# Patient Record
Sex: Female | Born: 1980 | Race: Black or African American | Hispanic: No | Marital: Single | Smoking: Current every day smoker
Health system: Southern US, Community
[De-identification: ages and names within clinical notes are randomized; demographics above are authoritative.]

## PROBLEM LIST (undated history)

## (undated) DIAGNOSIS — D649 Anemia, unspecified: Secondary | ICD-10-CM

## (undated) HISTORY — PX: ABDOMINAL HYSTERECTOMY: SHX81

## (undated) HISTORY — PX: CHOLECYSTECTOMY: SHX55

---

## 2016-03-14 ENCOUNTER — Encounter (HOSPITAL_BASED_OUTPATIENT_CLINIC_OR_DEPARTMENT_OTHER): Payer: Self-pay | Admitting: Emergency Medicine

## 2016-03-14 ENCOUNTER — Emergency Department (HOSPITAL_BASED_OUTPATIENT_CLINIC_OR_DEPARTMENT_OTHER)
Admission: EM | Admit: 2016-03-14 | Discharge: 2016-03-14 | Disposition: A | Discharge status: Home / Self Care | Payer: Self-pay | Attending: Emergency Medicine | Admitting: Emergency Medicine

## 2016-03-14 DIAGNOSIS — Z79899 Other long term (current) drug therapy: Secondary | ICD-10-CM | POA: Insufficient documentation

## 2016-03-14 DIAGNOSIS — M7660 Achilles tendinitis, unspecified leg: Secondary | ICD-10-CM

## 2016-03-14 DIAGNOSIS — F17200 Nicotine dependence, unspecified, uncomplicated: Secondary | ICD-10-CM | POA: Insufficient documentation

## 2016-03-14 DIAGNOSIS — M25571 Pain in right ankle and joints of right foot: Secondary | ICD-10-CM | POA: Insufficient documentation

## 2016-03-14 MED ORDER — NAPROXEN 500 MG PO TABS: 500.0000 mg | ORAL_TABLET | Freq: Two times a day (BID) | ORAL | Status: AC

## 2016-03-14 NOTE — ED Notes (Signed)
Pt having pain behind her ankle since Sunday.  Pt states its getting worse over time.  Pt believes its her achilles tendon.

## 2016-03-14 NOTE — ED Provider Notes (Signed)
CSN: [id number]     Arrival date & time 03/14/16  0855 History   First MD Initiated Contact with Patient 03/14/16 [phone]     Chief Complaint  Patient presents with  . Ankle Pain     (Consider location/radiation/quality/duration/timing/severity/associated sxs/prior Treatment) HPI Comments: Patient presents today with pain over the right Achilles Tendon.  She reports that the pain has been intermittent over the past 2 days, which is worsening.  She denies any acute injury or trauma.  Pain worse with dorsiflexion of the right foot and also with ambulation.  She has not taken anything for pain prior to arrival.  She denies any prior injury to the Achilles Tendon.  No erythema or swelling of the ankle or foot.  No numbness or tingling.  No fever or chills.    Patient is a 35 y.o. female presenting with ankle pain. The history is provided by the patient.  Ankle Pain   No past medical history on file. Past Surgical History  Procedure Laterality Date  . Cholecystectomy    . Cesarean section     No family history on file. Social History  Substance Use Topics  . Smoking status: Current Every Day Smoker  . Smokeless tobacco: None  . Alcohol Use: None   OB History    No data available     Review of Systems  All other systems reviewed and are negative.     Allergies  Review of patient's allergies indicates no known allergies.  Home Medications   Prior to Admission medications   Medication Sig Start Date End Date Taking? Authorizing Provider  ferrous sulfate 325 (65 FE) MG tablet Take 325 mg by mouth daily with breakfast.   Yes Historical Provider, MD  vitamin B-12 (CYANOCOBALAMIN) 1000 MCG tablet Take 1,000 mcg by mouth daily.   Yes Historical Provider, MD   BP 144/84 mmHg  Pulse 85  Temp(Src) 98 F (36.7 C) (Oral)  Resp 18  Ht  (1.499 m)  Wt 77.111 kg  BMI 34.32 kg/m2  SpO2 100%  LMP 02/28/2016 Physical Exam  Constitutional: She appears well-developed and  well-nourished.  HENT:  Head: Normocephalic and atraumatic.  Neck: Normal range of motion. Neck supple.  Cardiovascular: Normal rate, regular rhythm and normal heart sounds.   Pulses:      Dorsalis pedis pulses are 2+ on the right side, and 2+ on the left side.  Pulmonary/Chest: Effort normal and breath sounds normal.  Musculoskeletal: Normal range of motion.  Mild tenderness to palpation over the right Achilles tendon.  Increased pain with dorsiflexion of the right foot.  No erythema, edema, or warmth of the right foot or ankle.  No obvious deformity of the Achilles Tendon palpated.  Negative Thompson's sign on the right.  Neurological: She is alert.  Distal sensation of right foot intact  Skin: Skin is warm and dry.  Psychiatric: She has a normal mood and affect.  Nursing note and vitals reviewed.   ED Course  Procedures (including critical care time) Labs Review Labs Reviewed - No data to display  Imaging Review No results found. I have personally reviewed and evaluated these images and lab results as part of my medical decision-making.   EKG Interpretation None      MDM   Final diagnoses:  None   Patient presents today with pain of the right Achilles Tendon.  No acute injury or trauma.  No signs of infection.  Negative Thompson's sign.  Suspect Achilles Tendonitis.  RICE  instructions and ASO given to the patient.  Patient given referral to Orthopedics.  Stable for discharge.  Return precautions given.    Santiago GladHeather Yasheka Fossett, PA-C 03/14/16 [id number]  Lyndal Pulleyaniel Knott, MD 03/15/16 [phone]

## 2016-09-14 ENCOUNTER — Emergency Department (HOSPITAL_BASED_OUTPATIENT_CLINIC_OR_DEPARTMENT_OTHER)
Admission: EM | Admit: 2016-09-14 | Discharge: 2016-09-14 | Disposition: A | Discharge status: Home / Self Care | Payer: Self-pay | Attending: Emergency Medicine | Admitting: Emergency Medicine

## 2016-09-14 ENCOUNTER — Encounter (HOSPITAL_BASED_OUTPATIENT_CLINIC_OR_DEPARTMENT_OTHER): Payer: Self-pay | Admitting: *Deleted

## 2016-09-14 DIAGNOSIS — F17200 Nicotine dependence, unspecified, uncomplicated: Secondary | ICD-10-CM | POA: Insufficient documentation

## 2016-09-14 DIAGNOSIS — H1033 Unspecified acute conjunctivitis, bilateral: Secondary | ICD-10-CM

## 2016-09-14 DIAGNOSIS — H109 Unspecified conjunctivitis: Secondary | ICD-10-CM | POA: Insufficient documentation

## 2016-09-14 DIAGNOSIS — Z79899 Other long term (current) drug therapy: Secondary | ICD-10-CM | POA: Insufficient documentation

## 2016-09-14 MED ORDER — TOBRAMYCIN-DEXAMETHASONE 0.3-0.1 % OP SUSP: 2.0000 [drp] | OPHTHALMIC | 0 refills | Status: DC

## 2016-09-14 MED FILL — TOBRAMYCIN-DEXAMETH OPTH SU: 0.3-0.1 | 5 days supply | Qty: 5 | Fill #0

## 2016-09-14 NOTE — Discharge Instructions (Signed)
After using the drops, wash your hands, and the bottle of drops to prevent reinfection.  Place the drops in both eyes, then lay flat with a warm compress or your eyes for 10 minutes to increase absorption of the medications

## 2016-09-14 NOTE — ED Provider Notes (Signed)
[facility]   CSN: [id number] Arrival date & time: 09/14/16  0920     History   Chief Complaint Chief Complaint  Patient presents with  . Eye Problem    HPI Regina Henderson is a 35 y.o. female. She presents for evaluation of bilateral eye irritation. She noticed irritation last night. Her daughter's friend has been staying with him had "pinkeye". Her eyes are not painful. They're just irritated and draining. No lid swelling. No vision changes. No URI symptoms. No other complaints.  HPI  History reviewed. No pertinent past medical history.  There are no active problems to display for this patient.   Past Surgical History:  Procedure Laterality Date  . CESAREAN SECTION    . CHOLECYSTECTOMY      OB History    No data available       Home Medications    Prior to Admission medications   Medication Sig Start Date End Date Taking? Authorizing Provider  Ped Multivitamins-Fl-Iron (MULTI-VITAMIN/FLUORIDE/IRON PO) Take by mouth.   Yes Historical Provider, MD  ferrous sulfate 325 (65 FE) MG tablet Take 325 mg by mouth daily with breakfast.    Historical Provider, MD  naproxen (NAPROSYN) 500 MG tablet Take 1 tablet (500 mg total) by mouth 2 (two) times daily. 03/14/16   Heather Laisure, PA-C  tobramycin-dexamethasone (TOBRADEX) ophthalmic solution Place 2 drops into both eyes every 4 (four) hours while awake. 09/14/16   Rolland PorterMark Guido Comp, MD  vitamin B-12 (CYANOCOBALAMIN) 1000 MCG tablet Take 1,000 mcg by mouth daily.    Historical Provider, MD    Family History No family history on file.  Social History Social History  Substance Use Topics  . Smoking status: Current Every Day Smoker  . Smokeless tobacco: Not on file  . Alcohol use Not on file     Allergies   Review of patient's allergies indicates no known allergies.   Review of Systems Review of Systems  Constitutional: Negative for appetite change, chills, diaphoresis, fatigue and fever.    HENT: Negative for mouth sores, sore throat and trouble swallowing.   Eyes: Positive for discharge, redness and itching. Negative for visual disturbance.  Respiratory: Negative for cough, chest tightness, shortness of breath and wheezing.   Cardiovascular: Negative for chest pain.  Gastrointestinal: Negative for abdominal distention, abdominal pain, diarrhea, nausea and vomiting.  Endocrine: Negative for polydipsia, polyphagia and polyuria.  Genitourinary: Negative for dysuria, frequency and hematuria.  Musculoskeletal: Negative for gait problem.  Skin: Negative for color change, pallor and rash.  Neurological: Negative for dizziness, syncope, light-headedness and headaches.  Hematological: Does not bruise/bleed easily.  Psychiatric/Behavioral: Negative for behavioral problems and confusion.     Physical Exam Updated Vital Signs BP 151/90 (BP Location: Right Arm)   Pulse 108   Temp 98.8 F (37.1 C) (Oral)   Resp 20   Ht 4\' 10"  (1.473 m)   Wt 176 lb (79.8 kg)   LMP 09/07/2016 (Exact Date)   SpO2 100%   BMI 36.78 kg/m   Physical Exam  Constitutional: She is oriented to person, place, and time. She appears well-developed and well-nourished. No distress.  HENT:  Head: Normocephalic.  Eyes: Pupils are equal, round, and reactive to light. Right conjunctiva is injected. Left conjunctiva is injected. No scleral icterus.  Bilateral conjunctival injection. No lid edema. No localized hordeolum. No proptosis. Normal eye movement. Normal reactive pupils.  No photophobia  Neck: Normal range of motion. Neck supple. No thyromegaly present.  Cardiovascular: Normal rate  and regular rhythm.  Exam reveals no gallop and no friction rub.   No murmur heard. Pulmonary/Chest: Effort normal and breath sounds normal. No respiratory distress. She has no wheezes. She has no rales.  Abdominal: Soft. Bowel sounds are normal. She exhibits no distension. There is no tenderness. There is no rebound.   Musculoskeletal: Normal range of motion.  Neurological: She is alert and oriented to person, place, and time.  Skin: Skin is warm and dry. No rash noted.  Psychiatric: She has a normal mood and affect. Her behavior is normal.     ED Treatments / Results  Labs (all labs ordered are listed, but only abnormal results are displayed) Labs Reviewed - No data to display  EKG  EKG Interpretation None       Radiology No results found.  Procedures Procedures (including critical care time)  Medications Ordered in ED Medications - No data to display   Initial Impression / Assessment and Plan / ED Course  I have reviewed the triage vital signs and the nursing notes.  Pertinent labs & imaging results that were available during my care of the patient were reviewed by me and considered in my medical decision making (see chart for details).  Clinical Course      Final Clinical Impressions(s) / ED Diagnoses   Final diagnoses:  Acute conjunctivitis of both eyes, unspecified acute conjunctivitis type    New Prescriptions New Prescriptions   TOBRAMYCIN-DEXAMETHASONE (TOBRADEX) OPHTHALMIC SOLUTION    Place 2 drops into both eyes every 4 (four) hours while awake.     Rolland Porter, MD 09/14/16 [phone]

## 2016-09-14 NOTE — ED Triage Notes (Signed)
Patient states she had some bilateral eye irritation yesterday, woke up this morning with bilateral eye drainage, itching and crusting on eyes.

## 2019-09-23 ENCOUNTER — Emergency Department (HOSPITAL_BASED_OUTPATIENT_CLINIC_OR_DEPARTMENT_OTHER): Payer: BC Managed Care – PPO

## 2019-09-23 ENCOUNTER — Emergency Department (HOSPITAL_BASED_OUTPATIENT_CLINIC_OR_DEPARTMENT_OTHER)
Admission: EM | Admit: 2019-09-23 | Discharge: 2019-09-23 | Disposition: A | Discharge status: Home / Self Care | Payer: BC Managed Care – PPO | Attending: Emergency Medicine | Admitting: Emergency Medicine

## 2019-09-23 ENCOUNTER — Encounter (HOSPITAL_BASED_OUTPATIENT_CLINIC_OR_DEPARTMENT_OTHER): Payer: Self-pay | Admitting: *Deleted

## 2019-09-23 ENCOUNTER — Other Ambulatory Visit: Payer: Self-pay

## 2019-09-23 DIAGNOSIS — R519 Headache, unspecified: Secondary | ICD-10-CM | POA: Diagnosis present

## 2019-09-23 DIAGNOSIS — F17210 Nicotine dependence, cigarettes, uncomplicated: Secondary | ICD-10-CM | POA: Diagnosis not present

## 2019-09-23 HISTORY — DX: Anemia, unspecified: D64.9

## 2019-09-23 LAB — BASIC METABOLIC PANEL
Anion gap: 10 (ref 5–15)
BUN: 8 mg/dL (ref 6–20)
CO2: 20 mmol/L — ABNORMAL LOW (ref 22–32)
Calcium: 9.7 mg/dL (ref 8.9–10.3)
Chloride: 106 mmol/L (ref 98–111)
Creatinine, Ser: 0.78 mg/dL (ref 0.44–1.00)
GFR calc Af Amer: >60 mL/min (ref >60)
GFR calc non Af Amer: >60 mL/min (ref >60)
Glucose, Bld: 88 mg/dL (ref 70–99)
Potassium: 3.6 mmol/L (ref 3.5–5.1)
Sodium: 136 mmol/L (ref 135–145)

## 2019-09-23 LAB — CBC WITH DIFFERENTIAL/PLATELET
Abs Immature Granulocytes: 0.07 10*3/uL (ref 0.00–0.07)
Basophils Absolute: 0 10*3/uL (ref 0.0–0.1)
Basophils Relative: 0 %
Eosinophils Absolute: 0.4 10*3/uL (ref 0.0–0.5)
Eosinophils Relative: 4 %
HCT: 30.8 % — ABNORMAL LOW (ref 36.0–46.0)
Hemoglobin: 8.8 g/dL — ABNORMAL LOW (ref 12.0–15.0)
Immature Granulocytes: 1 %
Lymphocytes Relative: 30 %
Lymphs Abs: 3.3 10*3/uL (ref 0.7–4.0)
MCH: 21 pg — ABNORMAL LOW (ref 26.0–34.0)
MCHC: 28.6 g/dL — ABNORMAL LOW (ref 30.0–36.0)
MCV: 73.3 fL — ABNORMAL LOW (ref 80.0–100.0)
Monocytes Absolute: 0.6 10*3/uL (ref 0.1–1.0)
Monocytes Relative: 5 %
Neutro Abs: 6.6 10*3/uL (ref 1.7–7.7)
Neutrophils Relative %: 60 %
Platelets: 530 10*3/uL — ABNORMAL HIGH (ref 150–400)
RBC: 4.2 MIL/uL (ref 3.87–5.11)
RDW: 24.9 % — ABNORMAL HIGH (ref 11.5–15.5)
Smear Review: INCREASED
WBC: 11.1 10*3/uL — ABNORMAL HIGH (ref 4.0–10.5)
nRBC: 0.4 % — ABNORMAL HIGH (ref 0.0–0.2)

## 2019-09-23 MED ORDER — SODIUM CHLORIDE 0.9 % IV BOLUS
1000.0000 mL | Freq: Once | INTRAVENOUS | Status: AC
  Administered 2019-09-23: 1000 mL via INTRAVENOUS

## 2019-09-23 MED ORDER — PROCHLORPERAZINE EDISYLATE 10 MG/2ML IJ SOLN
10.0000 mg | Freq: Once | INTRAMUSCULAR | Status: AC
  Administered 2019-09-23: 10 mg via INTRAVENOUS
  Filled 2019-09-23: qty 2

## 2019-09-23 MED ORDER — DIPHENHYDRAMINE HCL 50 MG/ML IJ SOLN
25.0000 mg | Freq: Once | INTRAMUSCULAR | Status: AC
  Administered 2019-09-23: 25 mg via INTRAVENOUS
  Filled 2019-09-23: qty 1

## 2019-09-23 MED ORDER — KETOROLAC TROMETHAMINE 30 MG/ML IJ SOLN
30.0000 mg | Freq: Once | INTRAMUSCULAR | Status: AC
  Administered 2019-09-23: 30 mg via INTRAVENOUS
  Filled 2019-09-23: qty 1

## 2019-09-23 NOTE — ED Notes (Signed)
Pt returned from CT °

## 2019-09-23 NOTE — ED Triage Notes (Addendum)
Head ache to left side  x 1 week,  Denies inj  otc meds at times  States has ha when iron gets low but has been taking iron

## 2019-09-23 NOTE — ED Provider Notes (Signed)
[facility] EMERGENCY DEPARTMENT Provider Note   CSN: [id number] Arrival date & time: 09/23/19  1724     History   Chief Complaint Chief Complaint  Patient presents with  . Headache    HPI Regina Henderson is a 38 y.o. female with history of anemia presents for evaluation of gradual onset, progressively worsening left-sided headache for 1 week.  She reports that the headache is mostly constant but will improve after taking Advil PM in the evenings to the point that she is able to fall asleep.  She does note that the headache is still present when she awakens the next morning.  It is dull and throbbing "like a toothache ", worst posterior to the left ear but will radiate to the entire left side of the cranium when the headache worsens.  She also notes associated photophobia but denies blurred vision, dizziness, nausea, vomiting, chest pain, shortness of breath, numbness or weakness of the extremities, fever, or neck stiffness. No phonophobia. She states that she does have headaches when her iron gets low but this feels different from those headaches.  Reports she has been compliant with her iron supplement.     The history is provided by the patient.    Past Medical History:  Diagnosis Date  . Anemia     There are no active problems to display for this patient.   Past Surgical History:  Procedure Laterality Date  . CESAREAN SECTION    . CHOLECYSTECTOMY       OB History   No obstetric history on file.      Home Medications    Prior to Admission medications   Medication Sig Start Date End Date Taking? Authorizing Provider  ferrous sulfate 325 (65 FE) MG tablet Take 325 mg by mouth daily with breakfast.    [provider]  naproxen (NAPROSYN) 500 MG tablet Take 1 tablet (500 mg total) by mouth 2 (two) times daily. 03/14/16   Santiago Glad, PA-C  Ped Multivitamins-Fl-Iron (MULTI-VITAMIN/FLUORIDE/IRON PO) Take by mouth.    [provider]   tobramycin-dexamethasone Wallene Dales) ophthalmic solution Place 2 drops into both eyes every 4 (four) hours while awake. 09/14/16   Rolland Porter, MD  vitamin B-12 (CYANOCOBALAMIN) 1000 MCG tablet Take 1,000 mcg by mouth daily.    [provider]    Family History No family history on file.  Social History Social History   Tobacco Use  . Smoking status: Current Every Day Smoker  Substance Use Topics  . Alcohol use: Not on file  . Drug use: Not on file     Allergies   Patient has no known allergies.   Review of Systems Review of Systems  Constitutional: Negative for chills and fever.  HENT: Negative for ear discharge and ear pain.   Eyes: Positive for photophobia. Negative for visual disturbance.  Respiratory: Negative for shortness of breath.   Cardiovascular: Negative for chest pain.  Gastrointestinal: Negative for abdominal pain, nausea and vomiting.  Musculoskeletal: Negative for neck stiffness.  Neurological: Positive for headaches. Negative for syncope, weakness, light-headedness and numbness.  All other systems reviewed and are negative.    Physical Exam Updated Vital Signs BP 132/80 (BP Location: Left Arm)   Pulse 70   Temp 98.7 F (37.1 C)   Resp 20   Ht  (1.499 m)   Wt 83.9 kg   LMP 09/11/2019 (Approximate)   SpO2 99%   BMI 37.37 kg/m   Physical Exam Vitals signs and nursing  note reviewed.  Constitutional:      General: She is not in acute distress.    Appearance: She is well-developed.  HENT:     Head: Normocephalic and atraumatic.     Right Ear: Tympanic membrane, ear canal and external ear normal. No decreased hearing noted. No drainage, swelling or tenderness.     Left Ear: Tympanic membrane and ear canal normal. No decreased hearing noted. No drainage, swelling or tenderness.     Ears:     Comments: Mild left mastoid tenderness but no swelling, erythema, or crepitus.  Eyes:     General:        Right eye: No discharge.         Left eye: No discharge.     Extraocular Movements: Extraocular movements intact.     Conjunctiva/sclera: Conjunctivae normal.     Pupils: Pupils are equal, round, and reactive to light.     Comments: Some pain with EOMs of the left eye laterally. No restriction noted.  No periorbital swelling, erythema, or warmth.  No chemosis, proptosis, or consensual photophobia.  Neck:     Musculoskeletal: Normal range of motion and neck supple. No neck rigidity.     Vascular: No JVD.     Trachea: No tracheal deviation.  Cardiovascular:     Rate and Rhythm: Normal rate and regular rhythm.  Pulmonary:     Effort: Pulmonary effort is normal.     Breath sounds: Normal breath sounds.  Abdominal:     General: Bowel sounds are normal. There is no distension.     Palpations: Abdomen is soft.  Skin:    General: Skin is warm and dry.     Findings: No erythema.  Neurological:     Mental Status: She is alert and oriented to person, place, and time.     Comments: Mental Status:  Alert, thought content appropriate, able to give a coherent history. Speech fluent without evidence of aphasia. Able to follow 2 step commands without difficulty.  Cranial Nerves:  II:  Peripheral visual fields grossly normal, pupils equal, round, reactive to light III,IV, VI: ptosis not present, extra-ocular motions intact bilaterally  V,VII: smile symmetric, facial light touch sensation equal VIII: hearing grossly normal to voice  X: uvula elevates symmetrically  XI: bilateral shoulder shrug symmetric and strong XII: midline tongue extension without fassiculations Motor:  Normal tone. 5/5 strength of BUE and BLE major muscle groups including strong and equal grip strength and dorsiflexion/plantar flexion Sensory: light touch normal in all extremities. Cerebellar: normal finger-to-nose with bilateral upper extremities, Romberg sign absent.  Gait: normal gait and balance. Able to walk on toes and heels with ease.      Psychiatric:        Behavior: Behavior normal.      ED Treatments / Results  Labs (all labs ordered are listed, but only abnormal results are displayed) Labs Reviewed  BASIC METABOLIC PANEL - Abnormal; Notable for the following components:      Result Value   CO2 20 (*)    All other components within normal limits  CBC WITH DIFFERENTIAL/PLATELET - Abnormal; Notable for the following components:   WBC 11.1 (*)    Hemoglobin 8.8 (*)    HCT 30.8 (*)    MCV 73.3 (*)    MCH 21.0 (*)    MCHC 28.6 (*)    RDW 24.9 (*)    Platelets 530 (*)    nRBC 0.4 (*)    All other components  within normal limits    EKG None  Radiology Ct Head Wo Contrast  Result Date: 09/23/2019 CLINICAL DATA:  Left-sided headache for 1 week EXAM: CT HEAD WITHOUT CONTRAST TECHNIQUE: Contiguous axial images were obtained from the base of the skull through the vertex without intravenous contrast. COMPARISON:  May 04, 2018 FINDINGS: Brain: The ventricles are normal in size and free aeration. 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 There is no intracranial mass, hemorrhage, extra-axial fluid collection, or midline shift 1. Brain parenchyma appears unremarkable. No acute infarct is evident. Vascular: There is no hyperdense vessel. There is no vascular calcification. Skull: The bony calvarium appears intact. Sinuses/Orbits: Paranasal sinuses are clear. Orbits appear symmetric bilaterally. There is leftward deviation of the nasal septum. Other: Mastoid air cells are clear. IMPRESSION: Brain parenchyma appears unremarkable. No mass or hemorrhage. 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 There is leftward deviation of the nasal septum. Electronically Signed   By: Bretta Bang III M.D.   On: 09/23/2019 19:34    Procedures Procedures (including critical care time)  Medications Ordered in ED Medications  sodium chloride 0.9 % bolus 1,000 mL (0  mLs Intravenous Stopped 09/23/19 2119)  prochlorperazine (COMPAZINE) injection 10 mg (10 mg Intravenous Given 09/23/19 2022)  diphenhydrAMINE (BENADRYL) injection 25 mg (25 mg Intravenous Given 09/23/19 2020)  ketorolac (TORADOL) 30 MG/ML injection 30 mg (30 mg Intravenous Given 09/23/19 2018)     Initial Impression / Assessment and Plan / ED Course  I have reviewed the triage vital signs and the nursing notes.  Pertinent labs & imaging results that were available during my care of the patient were reviewed by me and considered in my medical decision making (see chart for details).        Patient presenting for evaluation of left-sided headache for 1 week.  She is afebrile, initially mildly hypertensive with improvement on reevaluation.  Some tenderness around the mastoid on examination but no ear pain, erythema, crepitus, swelling, or evidence of otitis externa or acute otitis media.  She has no constitutional symptoms.  I have a low suspicion of mastoiditis at this time.  Neurologic examination is normal with no focal deficits.  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  No red flag signs concerning for SAH, ICH, CVA, or meningitis.  She has no nuchal rigidity or meningeal signs. No evidence of ocular nerve entrapment, no vision changes. Doubt MS or optic neuritis. Lab work reviewed by me shows mild nonspecific leukocytosis, no metabolic derangements, no renal insufficiency.  Her hemoglobin is low at 8.8 but she tells me her baseline is around 6.  She was given a migraine cocktail in the ED and on reevaluation reports her headache is entirely resolved and that she is feeling much better and would like to go home.  Recommend follow-up with PCP or neurology on an outpatient basis for reevaluation of her headaches which sound like migraine headaches.  Discussed strict ED return  precautions.  Patient and daughter verbalized understanding of and agreement with plan and patient stable for discharge home at this time.  Final Clinical Impressions(s) / ED Diagnoses   Final diagnoses:  Left-sided headache    ED Discharge Orders    None       Jeanie Sewer, PA-C 09/24/19 1920  Deno Etienne, DO 09/24/19 1952

## 2019-09-23 NOTE — ED Notes (Signed)
Patient transported to CT 

## 2019-09-23 NOTE — Discharge Instructions (Addendum)
You can take 1 to 2 tablets of Tylenol (350mg -1000mg  depending on the dose) every 6 hours as needed for pain.  Do not exceed 4000 mg of Tylenol daily.  If your pain persists you can take a doses of ibuprofen in between doses of Tylenol.  I usually recommend 400 to 600 mg of ibuprofen every 6 hours.  Take this with food to avoid upset stomach issues.  Drink plenty fluids and get plenty of rest.  Can also apply heating pad 20 minutes at a time 3-4 times daily to the neck and do some gentle stretching to avoid muscle stiffness.  Follow-up with a PCP or a neurologist for reevaluation of your headaches.  Return to the emergency department if any concerning signs or symptoms develop such as severe worsening pain, vision changes, weakness to one side of the body, persistent vomiting, fevers, or loss of consciousness.

## 2019-11-12 ENCOUNTER — Encounter: Payer: Self-pay | Admitting: Diagnostic Neuroimaging

## 2019-11-12 ENCOUNTER — Ambulatory Visit (INDEPENDENT_AMBULATORY_CARE_PROVIDER_SITE_OTHER): Payer: BC Managed Care – PPO | Admitting: Diagnostic Neuroimaging

## 2019-11-12 ENCOUNTER — Other Ambulatory Visit: Payer: Self-pay

## 2019-11-12 VITALS — BP 151/99 | HR 83 | Temp 98.5°F | Ht 59.0 in | Wt 206.0 lb

## 2019-11-12 DIAGNOSIS — G43109 Migraine with aura, not intractable, without status migrainosus: Secondary | ICD-10-CM

## 2019-11-12 DIAGNOSIS — H538 Other visual disturbances: Secondary | ICD-10-CM | POA: Diagnosis not present

## 2019-11-12 DIAGNOSIS — G4489 Other headache syndrome: Secondary | ICD-10-CM | POA: Diagnosis not present

## 2019-11-12 DIAGNOSIS — R519 Headache, unspecified: Secondary | ICD-10-CM | POA: Diagnosis not present

## 2019-11-12 MED ORDER — RIZATRIPTAN BENZOATE 10 MG PO TBDP: 10.0000 mg | ORAL_TABLET | ORAL | 11 refills | Status: AC | PRN

## 2019-11-12 MED ORDER — TOPIRAMATE 50 MG PO TABS: 50.0000 mg | ORAL_TABLET | Freq: Two times a day (BID) | ORAL | 12 refills | Status: AC

## 2019-11-12 NOTE — Progress Notes (Signed)
[facility]  PATIENT: Regina Henderson DOB: May 25, 1981  REFERRING CLINICIAN: Calton DachW Woodruff HISTORY FROM: patient  REASON FOR VISIT: new consult    HISTORICAL  CHIEF COMPLAINT:  Chief Complaint  Patient presents with  . Headache    rm 7, New Pt "headache on left side of head x 3 weeks, Advil PM helps but puts me to sleep"    HISTORY OF PRESENT ILLNESS:   38 year old female here for evaluation of headaches.  September 2020 patient had onset of left temporal, left occipital, left neck headaches, pain, associated with heartbeat sensation, photophobia.  Sometimes associated with blurred vision, seeing dark spots, seeing sparkles.  No nausea or vomiting.  No prior similar headaches.  Patient was in the hospital for evaluation due to severe headaches and had CT scan of the head which were unremarkable.  Patient has had increased stress related to working from home and spending a lot of time in the computer.  Also has had 20 pound weight gain over the past year.  Patient has been having more sleeping problems including snoring.     REVIEW OF SYSTEMS: Full 14 system review of systems performed and negative with exception of: As per HPI.  ALLERGIES: No Known Allergies  HOME MEDICATIONS: Outpatient Medications Prior to Visit  Medication Sig Dispense Refill  . ferrous sulfate 325 (65 FE) MG tablet Take 325 mg by mouth daily with breakfast.    . naproxen (NAPROSYN) 500 MG tablet Take 1 tablet (500 mg total) by mouth 2 (two) times daily. 30 tablet 0  . vitamin B-12 (CYANOCOBALAMIN) 1000 MCG tablet Take 1,000 mcg by mouth daily.    . Ped Multivitamins-Fl-Iron (MULTI-VITAMIN/FLUORIDE/IRON PO) Take by mouth.    . tobramycin-dexamethasone (TOBRADEX) ophthalmic solution Place 2 drops into both eyes every 4 (four) hours while awake. 5 mL 0   No facility-administered medications prior to visit.     PAST MEDICAL HISTORY: Past Medical History:  Diagnosis Date  . Anemia     PAST SURGICAL HISTORY: Past Surgical History:  Procedure Laterality Date  . CESAREAN SECTION     x 2  . CHOLECYSTECTOMY      FAMILY HISTORY: Family History  Problem Relation Age of Onset  . Kidney failure Mother   . Diabetes Father   . Dementia Maternal Grandmother     SOCIAL HISTORY: Social History   Socioeconomic History  . Marital status: Single    Spouse name: Not on file  . Number of children: 2  . Years of education: Not on file  . Highest education level: High school graduate  Occupational History  . Not on file  Social Needs  . Financial resource strain: Not on file  . Food insecurity    Worry: Not on file    Inability: Not on file  . Transportation needs    Medical: Not on file    Non-medical: Not on file  Tobacco Use  . Smoking status: Current Every Day Smoker    Packs/day: 0.50  . Smokeless tobacco: Never Used  Substance and Sexual Activity  . Alcohol use: Not Currently  . Drug use: Not Currently  . Sexual activity: Not on file  Lifestyle  . Physical activity    Days per week: Not on file    Minutes per session: Not on file  . Stress: Not on file  Relationships  . Social Musicianconnections    Talks on phone: Not on file    Gets together: Not on file  Attends religious service: Not on file    Active member of club or organization: Not on file    Attends meetings of clubs or organizations: Not on file    Relationship status: Not on file  . Intimate partner violence    Fear of current or ex partner: Not on file    Emotionally abused: Not on file    Physically abused: Not on file    Forced sexual activity: Not on file  Other Topics Concern  . Not on file  Social History Narrative   Lives with youngest daughter   Caffeine- none in past week      PHYSICAL EXAM  GENERAL EXAM/CONSTITUTIONAL: Vitals:  Vitals:   11/12/19 0945  BP: (!) 151/99  Pulse: 83  Temp: 98.5 F (36.9 C)  Weight: 206 lb (93.4 kg)  Height: 4\' 11"  (1.499 m)      Body mass index is 41.61 kg/m. Wt Readings from Last 3 Encounters:  11/12/19 206 lb (93.4 kg)  09/23/19 185 lb (83.9 kg)  09/14/16 176 lb (79.8 kg)     Patient is in no distress; well developed, nourished and groomed; neck is supple  CARDIOVASCULAR:  Examination of carotid arteries is normal; no carotid bruits  Regular rate and rhythm, no murmurs  Examination of peripheral vascular system by observation and palpation is normal  EYES:  Ophthalmoscopic exam of optic discs and posterior segments is normal; no papilledema or hemorrhages  No exam data present  MUSCULOSKELETAL:  Gait, strength, tone, movements noted in Neurologic exam below  NEUROLOGIC: MENTAL STATUS:  No flowsheet data found.  awake, alert, oriented to person, place and time  recent and remote memory intact  normal attention and concentration  language fluent, comprehension intact, naming intact  fund of knowledge appropriate  CRANIAL NERVE:   2nd - no papilledema on fundoscopic exam  2nd, 3rd, 4th, 6th - pupils equal and reactive to light, visual fields full to confrontation, extraocular muscles intact, no nystagmus  5th - facial sensation symmetric  7th - facial strength symmetric  8th - hearing intact  9th - palate elevates symmetrically, uvula midline  11th - shoulder shrug symmetric  12th - tongue protrusion midline  MOTOR:   normal bulk and tone, full strength in the BUE, BLE  SENSORY:   normal and symmetric to light touch, pinprick, temperature, vibration  COORDINATION:   finger-nose-finger, fine finger movements normal  REFLEXES:   deep tendon reflexes present and symmetric  GAIT/STATION:   narrow based gait     DIAGNOSTIC DATA (LABS, IMAGING, TESTING) - I reviewed patient records, labs, notes, testing and imaging myself where available.  Lab Results  Component Value Date   WBC 11.1 (H) 09/23/2019   HGB 8.8 (L) 09/23/2019   HCT 30.8 (L) 09/23/2019   MCV  73.3 (L) 09/23/2019   PLT 530 (H) 09/23/2019      Component Value Date/Time   NA 136 09/23/2019 2011   K 3.6 09/23/2019 2011   CL 106 09/23/2019 2011   CO2 20 (L) 09/23/2019 2011   GLUCOSE 88 09/23/2019 2011   BUN 8 09/23/2019 2011   CREATININE 0.78 09/23/2019 2011   CALCIUM 9.7 09/23/2019 2011   GFRNONAA >60 09/23/2019 2011   GFRAA >60 09/23/2019 2011   No results found for: CHOL, HDL, LDLCALC, LDLDIRECT, TRIG, CHOLHDL No results found for: HGBA1C No results found for: VITAMINB12 No results found for: TSH   09/23/19 CT head [I reviewed images myself and agree with interpretation. -VRP]  -  negative   ASSESSMENT AND PLAN  38 y.o. year old female here with new onset headaches since October 2020, with migraine features.  Considerations include migraine, IH or sleep apnea associated headaches.  Patient has had some weight gain over the past year.  We will proceed with further work-up.  Also will try migraine treatments.  Dx:  1. New onset headache   2. Migraine with aura and without status migrainosus, not intractable   3. Other headache syndrome      PLAN:  NEW ONSET HEADACHES (? Migraine vs IIH vs sleep apnea) - check MRI brain w/wo  - ophthalmology evaluation (eval blurred vision)  - then consider LP  - consider sleep study  - MIGRAINE PREVENTION --> start topiramate 50mg  at bedtime; after 1 week increase to twice a day; drink plenty of water  - MIGRAINE RESCUE --> rizatriptan 10mg  as needed for breakthrough headache; may repeat x 1 after 2 hours; max 2 tabs per day or 8 per month    Orders Placed This Encounter  Procedures  . MR BRAIN W WO CONTRAST  . Ambulatory referral to Ophthalmology   Meds ordered this encounter  Medications  . topiramate (TOPAMAX) 50 MG tablet    Sig: Take 1 tablet (50 mg total) by mouth 2 (two) times daily.    Dispense:  60 tablet    Refill:  12  . rizatriptan (MAXALT-MLT) 10 MG disintegrating tablet    Sig: Take 1 tablet (10  mg total) by mouth as needed for migraine. May repeat in 2 hours if needed    Dispense:  9 tablet    Refill:  11   Return in about 4 months (around 03/12/2020).    , MD 11/12/2019, 10:20 AM Certified in Neurology, Neurophysiology and Neuroimaging  [facility] [address] [place], [address] [place] [phone]

## 2019-11-12 NOTE — Patient Instructions (Addendum)
NEW ONSET HEADACHES  - check MRI brain w/wo  - ophthalmology evaluation (eval blurred vision)  - then consider LP  - consider sleep study  - MIGRAINE PREVENTION --> start topiramate 50mg  at bedtime; after 1 week increase to twice a day; drink plenty of water  - MIGRAINE RESCUE --> rizatriptan 10mg  as needed for breakthrough headache; may repeat x 1 after 2 hours; max 2 tabs per day or 8 per month

## 2019-11-13 ENCOUNTER — Telehealth: Payer: Self-pay | Admitting: Diagnostic Neuroimaging

## 2019-11-13 NOTE — Telephone Encounter (Signed)
Patient called stating that she has been feeling dizzy/light headed/drowzy all day and believes it could be due to the topiramate (TOPAMAX) 50 MG tablet but is unsure.    Please follow up

## 2019-11-13 NOTE — Telephone Encounter (Signed)
Called patient and advised her those are common side effects of topamax. I advised she needs to drink plenty of water, take it at night (she is) and give it a week. She can stay on 50 mg nightly dose longer than a week if she wishes. I advised if she finds she cannot tolerate the side effects or they don't resolve to her satisfaction, she should call. Dr Leta Baptist can consider an alternative medication. Patient verbalized understanding, appreciation.

## 2019-11-13 NOTE — Telephone Encounter (Signed)
Agree. -VRP 

## 2019-11-24 ENCOUNTER — Telehealth: Payer: Self-pay

## 2019-11-24 NOTE — Telephone Encounter (Signed)
Per [employer], MRI Brain w/wo does not meet medical necessity criteria based on the info provided.   "Advanced imaging for evaluation of headaches is not indicated for primary headache in the absence of focal neurologic findings or failure to respond to an adequate course of treatment.  I have printed the clinical details and placed on Dr. Gladstone Lighter desk to review.

## 2020-03-15 ENCOUNTER — Ambulatory Visit: Payer: BC Managed Care – PPO | Admitting: Family Medicine

## 2021-01-06 IMAGING — CT CT HEAD W/O CM
3 series · 14 of 46 positions shown, 16 images · non-contrast
Comparison: May 04, 2018

CLINICAL DATA: Left-sided headache for 1 week

EXAM:
CT HEAD WITHOUT CONTRAST
TECHNIQUE: Contiguous axial images were obtained from the base of the skull
through the vertex without intravenous contrast.

[Series 2: head wo · axial · 0.42mm/px · z∈[+1090,+1210]mm · 8 of 29 slices shown, 10 images]
[im 3/29  brain]
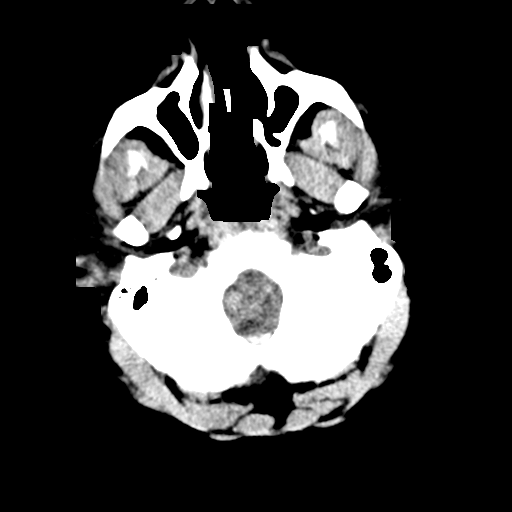
[im 3/29  bone]
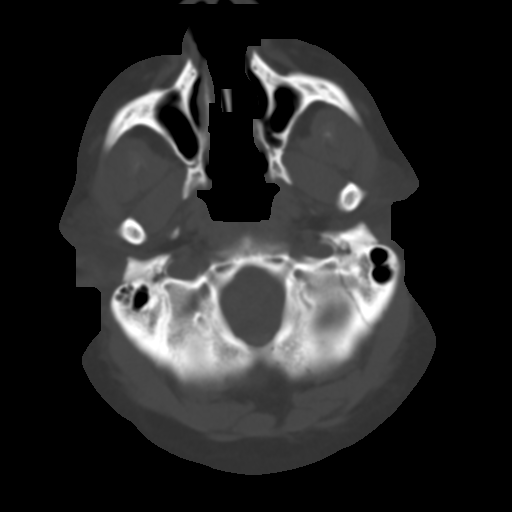
[im 7/29  brain]
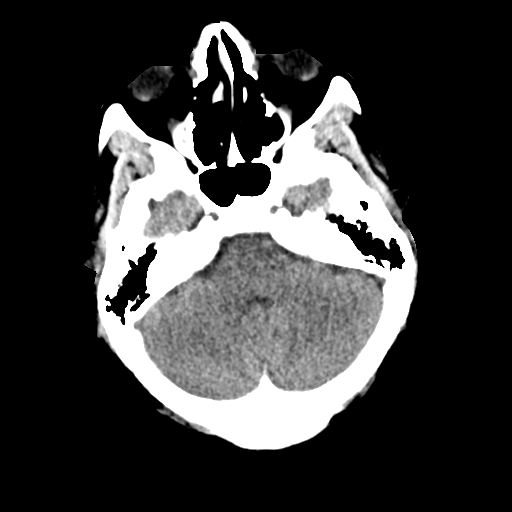
[im 10/29  brain]
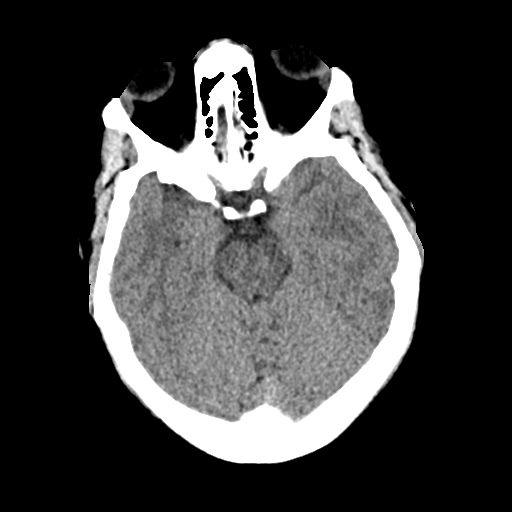
[im 13/29  brain]
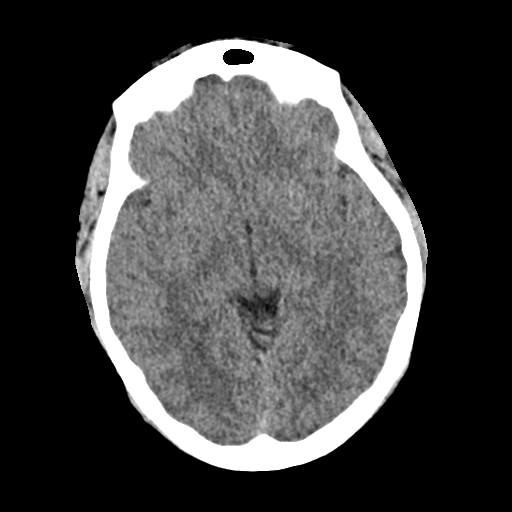
[im 17/29  brain]
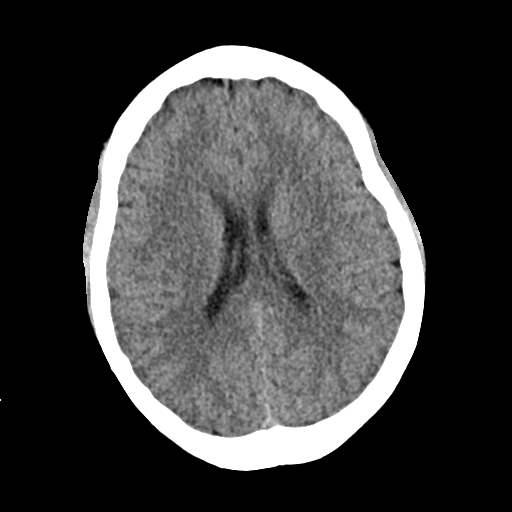
[im 17/29  bone]
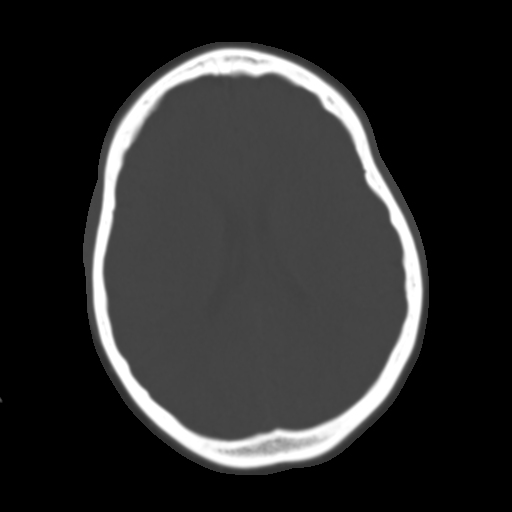
[im 20/29  brain]
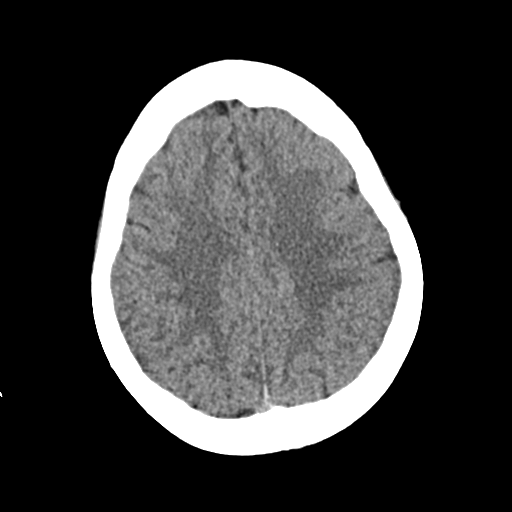
[im 23/29  brain]
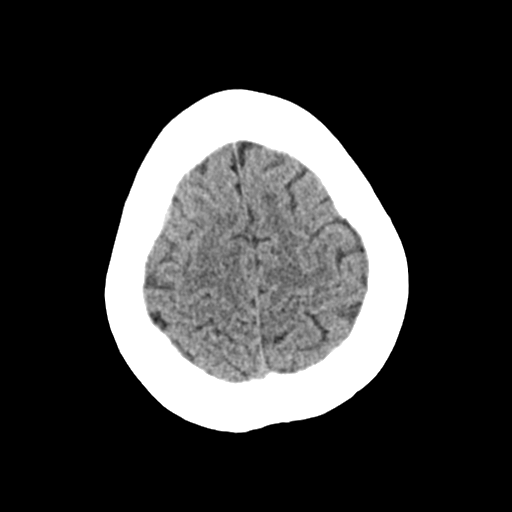
[im 27/29  brain]
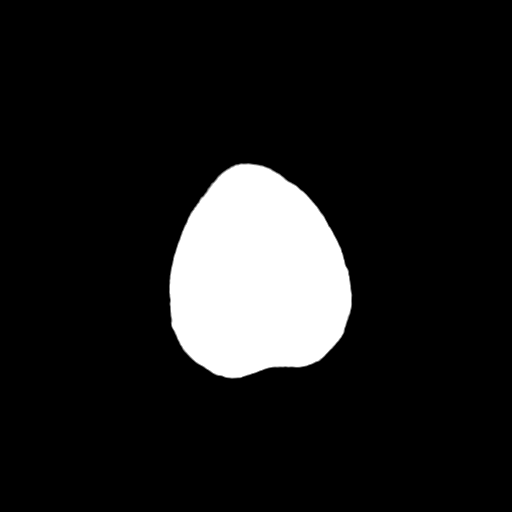

[Series 4: coronal soft · coronal · 0.29mm/px · 3 of 67 slices shown]
[im 23/67  brain]
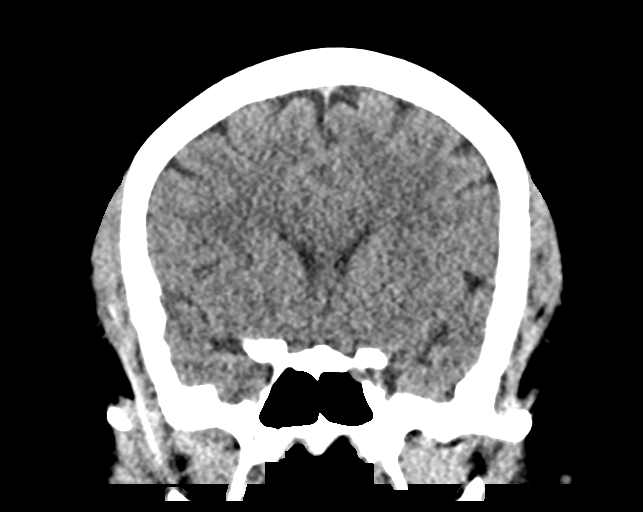
[im 30/67  brain]
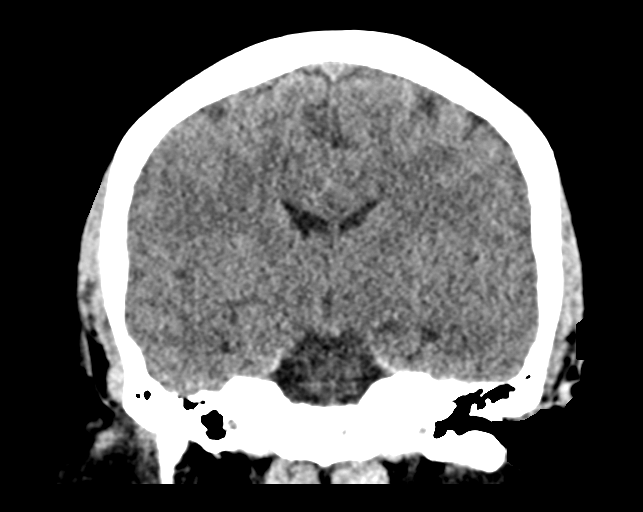
[im 37/67  brain]
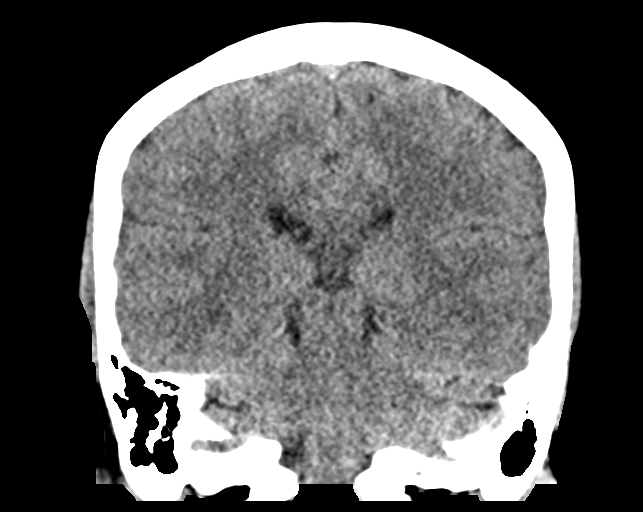

[Series 5: sag soft · sagittal · 0.29mm/px · 3 of 58 slices shown]
[im 20/58  brain]
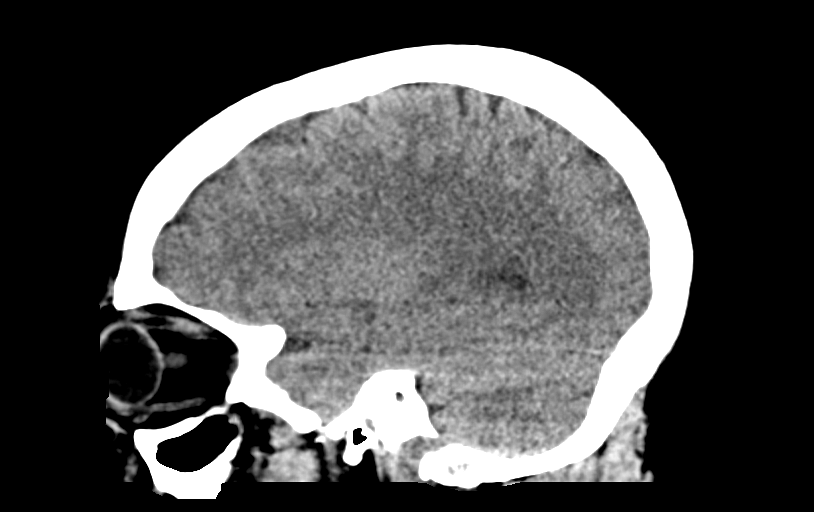
[im 29/58  brain]
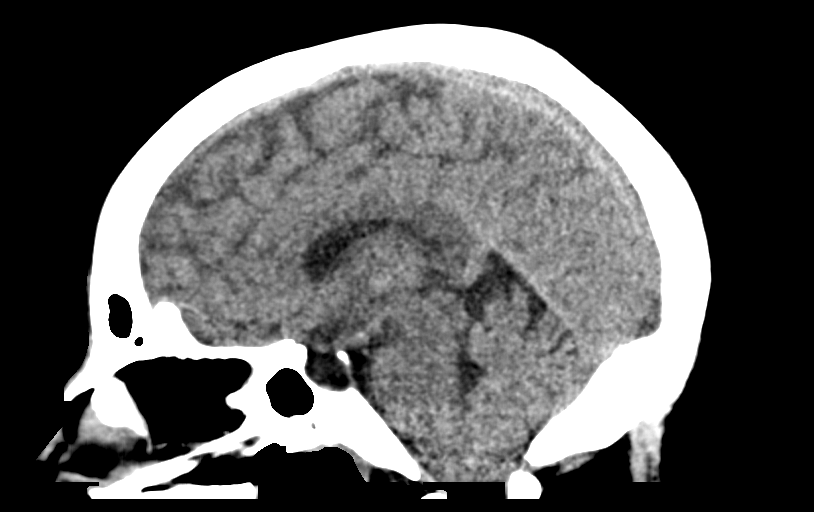
[im 39/58  brain]
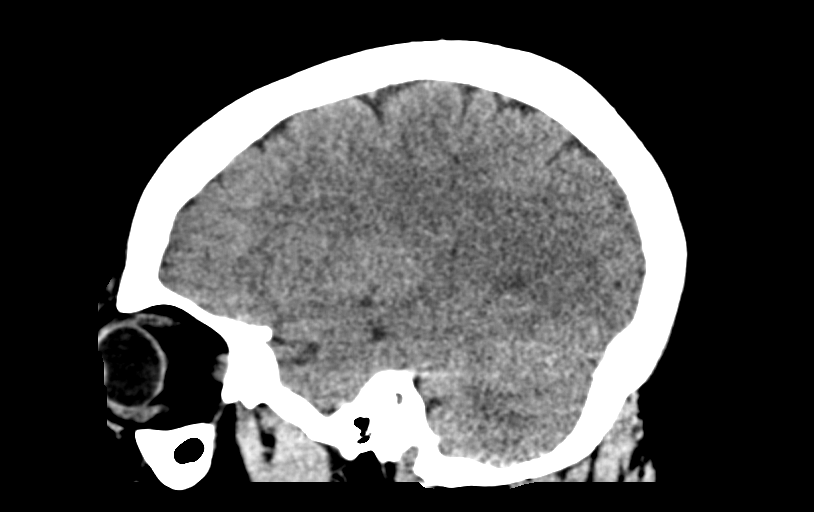

[14 of 46 positions shown; findings below may reference images not displayed]

FINDINGS: Brain: The ventricles are normal in size and free aeration. There is
mild invagination of CSF into the sella, a stable finding. There is
no intracranial mass, hemorrhage, extra-axial fluid collection, or
midline shift 1. Brain parenchyma appears unremarkable. No acute
infarct is evident.

Vascular: There is no hyperdense vessel. There is no vascular
calcification.

Skull: The bony calvarium appears intact.

Sinuses/Orbits: Paranasal sinuses are clear. Orbits appear symmetric
bilaterally. There is leftward deviation of the nasal septum.

Other: Mastoid air cells are clear.
IMPRESSION: Brain parenchyma appears unremarkable. No mass or hemorrhage. Mild
invagination of CSF into the sella is a stable finding of
questionable significance.

There is leftward deviation of the nasal septum.

## 2024-08-27 ENCOUNTER — Emergency Department (HOSPITAL_BASED_OUTPATIENT_CLINIC_OR_DEPARTMENT_OTHER)

## 2024-08-27 ENCOUNTER — Other Ambulatory Visit: Payer: Self-pay

## 2024-08-27 ENCOUNTER — Encounter (HOSPITAL_BASED_OUTPATIENT_CLINIC_OR_DEPARTMENT_OTHER): Payer: Self-pay | Admitting: Emergency Medicine

## 2024-08-27 ENCOUNTER — Emergency Department (HOSPITAL_BASED_OUTPATIENT_CLINIC_OR_DEPARTMENT_OTHER)
Admission: EM | Admit: 2024-08-27 | Discharge: 2024-08-27 | Disposition: A | Discharge status: Home / Self Care | Attending: Emergency Medicine | Admitting: Emergency Medicine

## 2024-08-27 DIAGNOSIS — M62830 Muscle spasm of back: Secondary | ICD-10-CM | POA: Diagnosis not present

## 2024-08-27 DIAGNOSIS — M546 Pain in thoracic spine: Secondary | ICD-10-CM

## 2024-08-27 LAB — URINALYSIS, MICROSCOPIC (REFLEX)

## 2024-08-27 LAB — URINALYSIS, ROUTINE W REFLEX MICROSCOPIC
Bilirubin Urine: NEGATIVE
Glucose, UA: NEGATIVE mg/dL
Ketones, ur: NEGATIVE mg/dL
Nitrite: NEGATIVE
Protein, ur: NEGATIVE mg/dL
Specific Gravity, Urine: 1.015 (ref 1.005–1.030)
pH: 6 (ref 5.0–8.0)

## 2024-08-27 MED ORDER — IBUPROFEN 600 MG PO TABS: 600.0000 mg | ORAL_TABLET | Freq: Four times a day (QID) | ORAL | 0 refills | Status: AC | PRN

## 2024-08-27 MED ORDER — IBUPROFEN 400 MG PO TABS
600.0000 mg | ORAL_TABLET | Freq: Once | ORAL | Status: AC
  Administered 2024-08-27: 600 mg via ORAL
  Filled 2024-08-27: qty 1

## 2024-08-27 MED ORDER — METHOCARBAMOL 500 MG PO TABS
500.0000 mg | ORAL_TABLET | Freq: Three times a day (TID) | ORAL | 0 refills | Status: AC
Start: 2024-08-27 — End: ?

## 2024-08-27 MED ORDER — HYDROCODONE-ACETAMINOPHEN 5-325 MG PO TABS: 1.0000 | ORAL_TABLET | ORAL | 0 refills | Status: AC | PRN

## 2024-08-27 MED ORDER — METHOCARBAMOL 500 MG PO TABS
1000.0000 mg | ORAL_TABLET | Freq: Once | ORAL | Status: AC
  Administered 2024-08-27: 1000 mg via ORAL
  Filled 2024-08-27: qty 2

## 2024-08-27 NOTE — Discharge Instructions (Signed)
 As we discussed, take the medications as prescribed. Limit heat to the back to 20 minutes periods as instructed. Follow up with your doctor if pain persists.

## 2024-08-27 NOTE — ED Provider Notes (Signed)
 [place] [facility] Provider Note   CSN: [id number] Arrival date & time: 08/27/24  9080     Patient presents with: Back Pain   Regina Henderson is a 43 y.o. female.   Patient to ED with complaint of bilateral upper back pain that started 2-3 days ago and has gotten progressively worse. No known injury. The pain is better with rest, worse with movement. No pleuritic pain or SOB. She reports the pain is the worst it's been this morning but also reports sleeping all night on her heating pad. No radiation of the pain into extremities, no weakness, numbness, chest pain. No history of clots.  The history is provided by the patient. No language interpreter was used.  Back Pain      Prior to Admission medications   Medication Sig Start Date End Date Taking? Authorizing Provider  ferrous sulfate 325 (65 FE) MG tablet Take 325 mg by mouth daily with breakfast.    [provider]  HYDROcodone -acetaminophen  (NORCO/VICODIN) 5-325 MG tablet Take 1 tablet by mouth every 4 (four) hours as needed for severe pain (pain score 7-10). 08/27/24  Yes Conna Terada, Margit, PA-C  ibuprofen  (ADVIL ) 600 MG tablet Take 1 tablet (600 mg total) by mouth every 6 (six) hours as needed. 08/27/24  Yes Tamikia Chowning, Margit, PA-C  methocarbamol  (ROBAXIN ) 500 MG tablet Take 1 tablet (500 mg total) by mouth 3 (three) times daily. 08/27/24  Yes Glenys Snader, Margit, PA-C  naproxen  (NAPROSYN ) 500 MG tablet Take 1 tablet (500 mg total) by mouth 2 (two) times daily. 03/14/16   Nasario Moats, PA-C  rizatriptan  (MAXALT -MLT) 10 MG disintegrating tablet Take 1 tablet (10 mg total) by mouth as needed for migraine. May repeat in 2 hours if needed 11/12/19   Penumalli, Eduard SAUNDERS, MD  topiramate  (TOPAMAX ) 50 MG tablet Take 1 tablet (50 mg total) by mouth 2 (two) times daily. 11/12/19   Penumalli, Vikram R, MD  vitamin B-12 (CYANOCOBALAMIN) 1000 MCG tablet Take 1,000 mcg by mouth daily.    [provider]    Allergies: Patient has no known allergies.    Review of Systems  Musculoskeletal:  Positive for back pain.    Updated Vital Signs BP (!) 180/86 (BP Location: Left Arm)   Pulse (!) 50   Temp 98.1 F (36.7 C) (Oral)   Resp 16   Ht 4' 11 (1.499 m)   Wt 93.4 kg   SpO2 100%   BMI 41.59 kg/m   Physical Exam Vitals and nursing note reviewed.  Constitutional:      Appearance: She is well-developed.  HENT:     Head: Normocephalic.  Cardiovascular:     Rate and Rhythm: Normal rate and regular rhythm.     Heart sounds: No murmur heard. Pulmonary:     Effort: Pulmonary effort is normal.     Breath sounds: Normal breath sounds. No wheezing, rhonchi or rales.  Abdominal:     General: Bowel sounds are normal.     Palpations: Abdomen is soft.     Tenderness: There is no abdominal tenderness. There is no guarding or rebound.  Musculoskeletal:        General: Normal range of motion.     Cervical back: Normal range of motion and neck supple.       Back:     Comments: Tender musculature of the parathoracic back. No midline tenderness.   Skin:    General: Skin is warm and dry.  Neurological:  General: No focal deficit present.     Mental Status: She is alert and oriented to person, place, and time.     (all labs ordered are listed, but only abnormal results are displayed) Labs Reviewed  URINALYSIS, ROUTINE W REFLEX MICROSCOPIC - Abnormal; Notable for the following components:      Result Value   Hgb urine dipstick TRACE (*)    Leukocytes,Ua TRACE (*)    All other components within normal limits  URINALYSIS, MICROSCOPIC (REFLEX) - Abnormal; Notable for the following components:   Bacteria, UA FEW (*)    All other components within normal limits    EKG: None  Radiology: DG Chest 2 View Result Date: 08/27/2024 CLINICAL DATA:  back pain EXAM: CHEST - 2 VIEW COMPARISON:  July 07, 2024 FINDINGS: No focal airspace consolidation, pleural effusion, or pneumothorax. No  cardiomegaly.No acute fracture or destructive lesion. IMPRESSION: No acute cardiopulmonary abnormality. Electronically Signed   By: Rogelia Myers M.D.   On: 08/27/2024 13:23     Procedures   Medications Ordered in the ED  ibuprofen  (ADVIL ) tablet 600 mg (600 mg Oral Given 08/27/24 1247)  methocarbamol  (ROBAXIN ) tablet 1,000 mg (1,000 mg Oral Given 08/27/24 1247)    Clinical Course as of 08/27/24 1348  Wed Aug 27, 2024  1243 Patient with bilateral upper back pain. No SOB, pleuritic pain. No chest pain. Following MSK pattern and worse with movement, better with rest. No sxs concerning for PE - no tachycardia, pleurisy, SOB, hypoxia. No h/o clots. No fever to suggest PNA. Doubt PTX.  [SU]  1342 CXR clear for both PNA and PTX. Pain is some better with Robaxin  and ibuprofen . Feel she is stable for discharge home, PCP follow up.   [SU]    Clinical Course User Index [SU] Odell Balls, PA-C                                 Medical Decision Making Amount and/or Complexity of Data Reviewed Labs: ordered. Radiology: ordered.  Risk Prescription drug management.        Final diagnoses:  Muscle spasm of back  Acute bilateral thoracic back pain    ED Discharge Orders          Ordered    ibuprofen  (ADVIL ) 600 MG tablet  Every 6 hours PRN        08/27/24 1348    methocarbamol  (ROBAXIN ) 500 MG tablet  3 times daily        08/27/24 1348    HYDROcodone -acetaminophen  (NORCO/VICODIN) 5-325 MG tablet  Every 4 hours PRN        08/27/24 1348               Odell Balls, PA-C 08/27/24 1348    Rogelia Jerilynn RAMAN, MD 08/28/24 [phone]

## 2024-08-27 NOTE — ED Triage Notes (Addendum)
 Back pain middle x 2 days pain woke her up this am  denies dysuria no falling no injury

## 2024-08-27 NOTE — ED Notes (Signed)
 Pt s daughter was given work note
